# Patient Record
Sex: Male | Born: 1955 | Race: White | Hispanic: No | Marital: Married | Smoking: Never smoker
Health system: Southern US, Community
[De-identification: ages and names within clinical notes are randomized; demographics above are authoritative.]

## PROBLEM LIST (undated history)

## (undated) DIAGNOSIS — I10 Essential (primary) hypertension: Secondary | ICD-10-CM

## (undated) DIAGNOSIS — E7800 Pure hypercholesterolemia, unspecified: Secondary | ICD-10-CM

## (undated) DIAGNOSIS — C801 Malignant (primary) neoplasm, unspecified: Secondary | ICD-10-CM

## (undated) HISTORY — PX: TONSILLECTOMY: SUR1361

---

## 2011-12-17 HISTORY — PX: PROSTATECTOMY: SHX69

## 2016-12-13 ENCOUNTER — Emergency Department (HOSPITAL_BASED_OUTPATIENT_CLINIC_OR_DEPARTMENT_OTHER)
Admission: EM | Admit: 2016-12-13 | Discharge: 2016-12-13 | Disposition: A | Discharge status: Home / Self Care | Payer: BLUE CROSS/BLUE SHIELD | Attending: Emergency Medicine | Admitting: Emergency Medicine

## 2016-12-13 ENCOUNTER — Emergency Department (HOSPITAL_BASED_OUTPATIENT_CLINIC_OR_DEPARTMENT_OTHER): Payer: BLUE CROSS/BLUE SHIELD

## 2016-12-13 ENCOUNTER — Encounter (HOSPITAL_BASED_OUTPATIENT_CLINIC_OR_DEPARTMENT_OTHER): Payer: Self-pay

## 2016-12-13 DIAGNOSIS — N200 Calculus of kidney: Secondary | ICD-10-CM | POA: Diagnosis not present

## 2016-12-13 DIAGNOSIS — R109 Unspecified abdominal pain: Secondary | ICD-10-CM | POA: Diagnosis present

## 2016-12-13 DIAGNOSIS — I10 Essential (primary) hypertension: Secondary | ICD-10-CM | POA: Diagnosis not present

## 2016-12-13 HISTORY — DX: Pure hypercholesterolemia, unspecified: E78.00

## 2016-12-13 HISTORY — DX: Malignant (primary) neoplasm, unspecified: C80.1

## 2016-12-13 HISTORY — DX: Essential (primary) hypertension: I10

## 2016-12-13 LAB — CBC WITH DIFFERENTIAL/PLATELET
BASOS PCT: 0 %
Basophils Absolute: 0 10*3/uL (ref 0.0–0.1)
EOS ABS: 0.1 10*3/uL (ref 0.0–0.7)
Eosinophils Relative: 1 %
HEMATOCRIT: 44.4 % (ref 39.0–52.0)
Hemoglobin: 15.3 g/dL (ref 13.0–17.0)
Lymphocytes Relative: 20 %
Lymphs Abs: 1.8 10*3/uL (ref 0.7–4.0)
MCH: 29.5 pg (ref 26.0–34.0)
MCHC: 34.5 g/dL (ref 30.0–36.0)
MCV: 85.7 fL (ref 78.0–100.0)
MONO ABS: 0.8 10*3/uL (ref 0.1–1.0)
MONOS PCT: 8 %
NEUTROS ABS: 6.5 10*3/uL (ref 1.7–7.7)
Neutrophils Relative %: 71 %
Platelets: 223 10*3/uL (ref 150–400)
RBC: 5.18 MIL/uL (ref 4.22–5.81)
RDW: 13.2 % (ref 11.5–15.5)
WBC: 9.2 10*3/uL (ref 4.0–10.5)

## 2016-12-13 LAB — URINALYSIS, ROUTINE W REFLEX MICROSCOPIC
BILIRUBIN URINE: NEGATIVE
Glucose, UA: NEGATIVE mg/dL
KETONES UR: NEGATIVE mg/dL
Leukocytes, UA: NEGATIVE
NITRITE: NEGATIVE
PH: 5.5 (ref 5.0–8.0)
Protein, ur: NEGATIVE mg/dL
Specific Gravity, Urine: 1.022 (ref 1.005–1.030)

## 2016-12-13 LAB — BASIC METABOLIC PANEL
Anion gap: 10 (ref 5–15)
BUN: 23 mg/dL — ABNORMAL HIGH (ref 6–20)
CALCIUM: 8.3 mg/dL — AB (ref 8.9–10.3)
CO2: 23 mmol/L (ref 22–32)
CREATININE: 1.24 mg/dL (ref 0.61–1.24)
Chloride: 101 mmol/L (ref 101–111)
GFR calc Af Amer: >60 mL/min (ref >60)
GFR calc non Af Amer: >60 mL/min (ref >60)
GLUCOSE: 144 mg/dL — AB (ref 65–99)
Potassium: 3.1 mmol/L — ABNORMAL LOW (ref 3.5–5.1)
Sodium: 134 mmol/L — ABNORMAL LOW (ref 135–145)

## 2016-12-13 LAB — URINALYSIS, MICROSCOPIC (REFLEX)

## 2016-12-13 MED ORDER — KETOROLAC TROMETHAMINE 30 MG/ML IJ SOLN
30.0000 mg | Freq: Once | INTRAMUSCULAR | Status: AC
  Administered 2016-12-13: 30 mg via INTRAVENOUS
  Filled 2016-12-13: qty 1

## 2016-12-13 MED ORDER — ONDANSETRON HCL 4 MG/2ML IJ SOLN
4.0000 mg | Freq: Once | INTRAMUSCULAR | Status: AC
  Administered 2016-12-13: 4 mg via INTRAVENOUS
  Filled 2016-12-13: qty 2

## 2016-12-13 MED ORDER — SODIUM CHLORIDE 0.9 % IV BOLUS (SEPSIS)
500.0000 mL | Freq: Once | INTRAVENOUS | Status: AC
  Administered 2016-12-13: 500 mL via INTRAVENOUS

## 2016-12-13 MED ORDER — MORPHINE SULFATE (PF) 4 MG/ML IV SOLN
4.0000 mg | Freq: Once | INTRAVENOUS | Status: AC
  Administered 2016-12-13: 4 mg via INTRAVENOUS
  Filled 2016-12-13: qty 1

## 2016-12-13 NOTE — ED Triage Notes (Signed)
Pt states that yesterday he did not feel well, had 1 episode of vomiting and multiple episodes of diarrhea.  States that he did not drink a lot of fluids yesterday or eat.  He says that he was woken up at 0300 this morning with left flank pain.  He states that since then he had several glasses of water and was able to hold that down and says he feels better now.  He complains also of dysuria.

## 2016-12-13 NOTE — ED Notes (Signed)
Pt seems to have passed the stone with the urine sample provided

## 2016-12-13 NOTE — ED Notes (Signed)
Pt verbalizes understanding of d/c instructions an denies any further need at this time

## 2016-12-13 NOTE — Discharge Instructions (Signed)
Return to the ER if you develop high fever, worsening pain, vomiting, or other new and concerning symptoms.

## 2016-12-13 NOTE — ED Provider Notes (Signed)
[facility] Provider Note   CSN: [id number] Arrival date & time: 12/13/16  0531     History   Chief Complaint Chief Complaint  Patient presents with  . Flank Pain    HPI Albert Fernandez is a 60 y.o. male.  Patient is a 60 year old male who presents with complaints of left flank pain. He reports an episode of loose stools and vomiting yesterday. This seemed to subside, however he woke up early this morning with severe pain in his left flank. He feels nauseated but has not vomited since this pain began. He denies any fevers or chills. He denies urinary complaints.   The history is provided by the patient.  Flank Pain  This is a new problem. The current episode started less than 1 hour ago. The problem occurs constantly. The problem has been gradually improving. Pertinent negatives include no abdominal pain. Nothing aggravates the symptoms. Nothing relieves the symptoms. He has tried nothing for the symptoms.    Past Medical History:  Diagnosis Date  . High cholesterol   . Hypertension     There are no active problems to display for this patient.   Past Surgical History:  Procedure Laterality Date  . TONSILLECTOMY         Home Medications    Prior to Admission medications   Not on File    Family History No family history on file.  Social History Social History  Substance Use Topics  . Smoking status: Never Smoker  . Smokeless tobacco: Never Used  . Alcohol use Yes     Allergies   Shellfish allergy   Review of Systems Review of Systems  Gastrointestinal: Negative for abdominal pain.  Genitourinary: Positive for flank pain.  All other systems reviewed and are negative.    Physical Exam Updated Vital Signs BP 146/89 (BP Location: Right Arm)   Pulse 88   Temp 98.5 F (36.9 C) (Oral)   Resp 16   Ht 5\' 10"  (1.778 m)   Wt 210 lb (95.3 kg)   SpO2 96%   BMI 30.13 kg/m   Physical Exam  Constitutional: He is oriented to person,  place, and time. He appears well-developed and well-nourished. No distress.  HENT:  Head: Normocephalic and atraumatic.  Mouth/Throat: Oropharynx is clear and moist.  Neck: Normal range of motion. Neck supple.  Cardiovascular: Normal rate and regular rhythm.  Exam reveals no friction rub.   No murmur heard. Pulmonary/Chest: Effort normal and breath sounds normal. No respiratory distress. He has no wheezes. He has no rales.  Abdominal: Soft. Bowel sounds are normal. He exhibits no distension. There is tenderness. There is no rebound and no guarding.  There is tenderness to palpation in the left flank.  Musculoskeletal: Normal range of motion. He exhibits no edema.  Neurological: He is alert and oriented to person, place, and time. Coordination normal.  Skin: Skin is warm and dry. He is not diaphoretic.  Nursing note and vitals reviewed.    ED Treatments / Results  Labs (all labs ordered are listed, but only abnormal results are displayed) Labs Reviewed  BASIC METABOLIC PANEL  CBC WITH DIFFERENTIAL/PLATELET  URINALYSIS, ROUTINE W REFLEX MICROSCOPIC    EKG  EKG Interpretation None       Radiology No results found.  Procedures Procedures (including critical care time)  Medications Ordered in ED Medications  ketorolac (TORADOL) 30 MG/ML injection 30 mg (not administered)  morphine 4 MG/ML injection 4 mg (not administered)  ondansetron (ZOFRAN)  injection 4 mg (not administered)  sodium chloride 0.9 % bolus 500 mL (not administered)     Initial Impression / Assessment and Plan / ED Course  I have reviewed the triage vital signs and the nursing notes.  Pertinent labs & imaging results that were available during my care of the patient were reviewed by me and considered in my medical decision making (see chart for details).  Clinical Course     CT scan shows what appears to be a recently passed 4 mm stone within the urinary bladder. The patient's symptoms have resolved.  He urinated and there was a small stone noted. He will be discharged with when necessary return. His urinalysis shows blood, however no evidence for infection.  Final Clinical Impressions(s) / ED Diagnoses   Final diagnoses:  None    New Prescriptions New Prescriptions   No medications on file     Veryl Speak, MD 12/13/16 [phone]

## 2017-05-24 IMAGING — CT CT RENAL STONE PROTOCOL
2 of 4 series · 16 of 46 positions shown, 18 images · non-contrast
Comparison: None.

CLINICAL DATA: Left flank pain. Dysuria. History of prostatectomy.

EXAM:
CT ABDOMEN AND PELVIS WITHOUT CONTRAST
TECHNIQUE: Multidetector CT imaging of the abdomen and pelvis was performed
following the standard protocol without IV contrast.

[Series 2: axial st · axial · 0.92mm/px · z∈[-452,-12]mm · 13 of 98 slices shown, 15 images]
[im 5/98  soft-tissue]
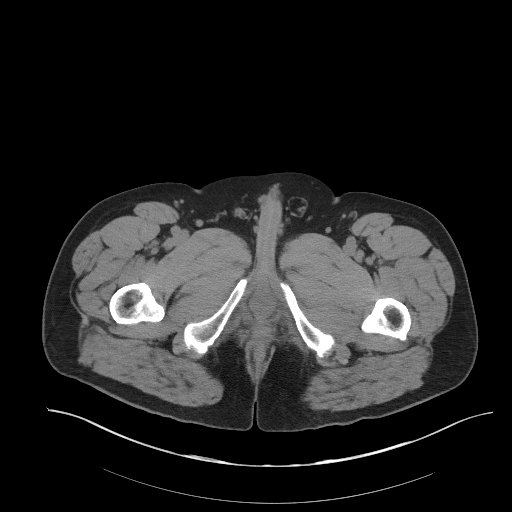
[im 5/98  bone]
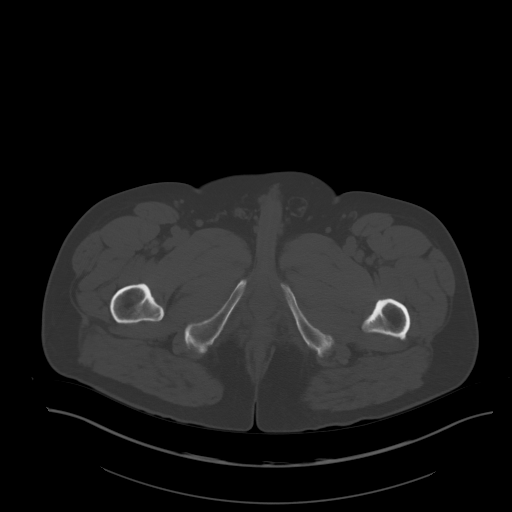
[im 13/98  soft-tissue]
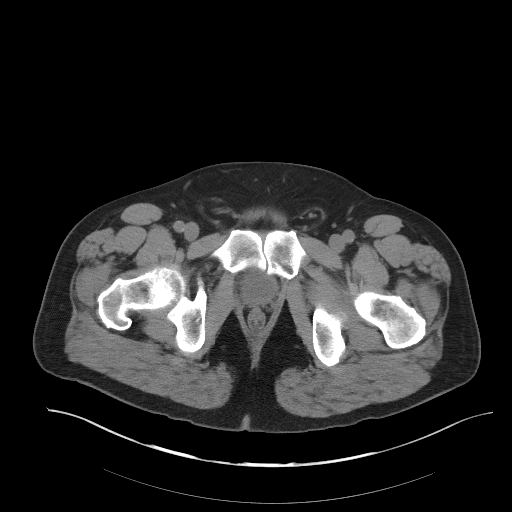
[im 21/98  soft-tissue]
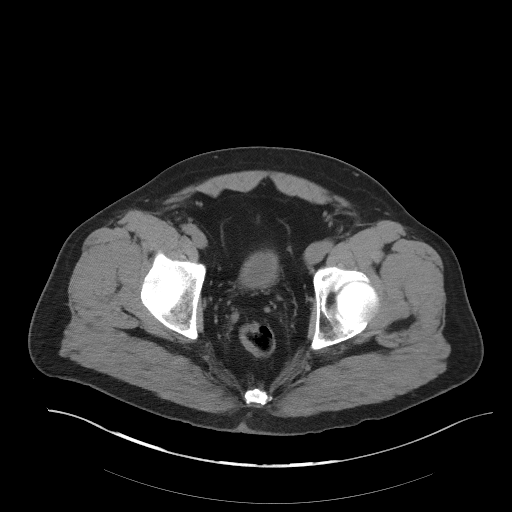
[im 29/98  soft-tissue]
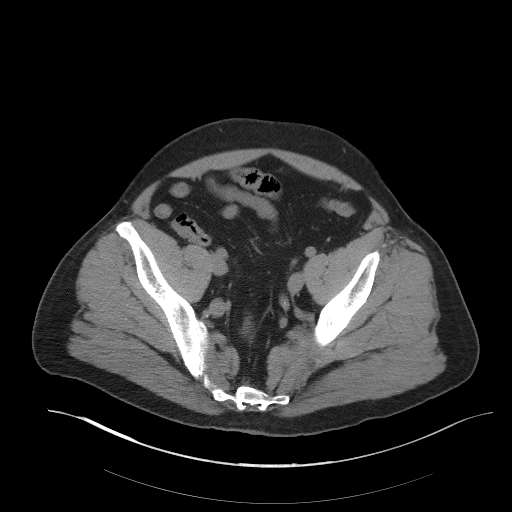
[im 33/98  soft-tissue]
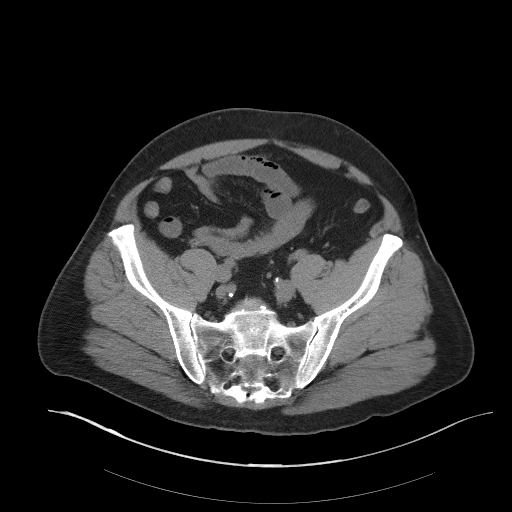
[im 41/98  soft-tissue]
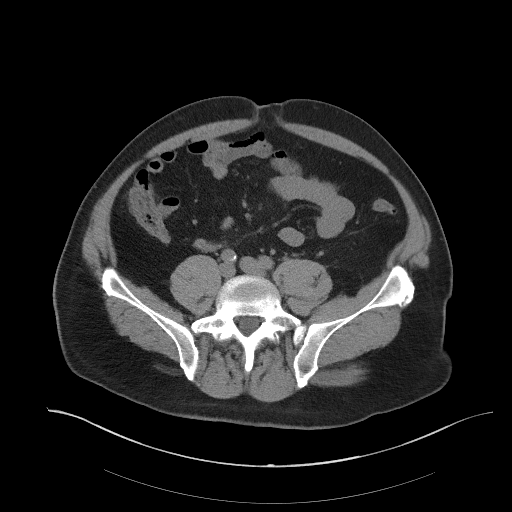
[im 49/98  soft-tissue]
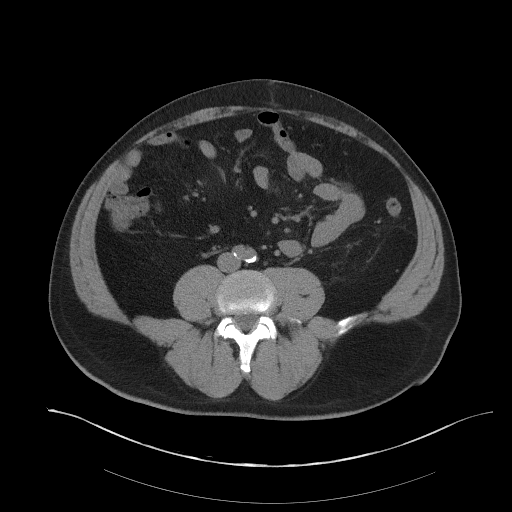
[im 57/98  soft-tissue]
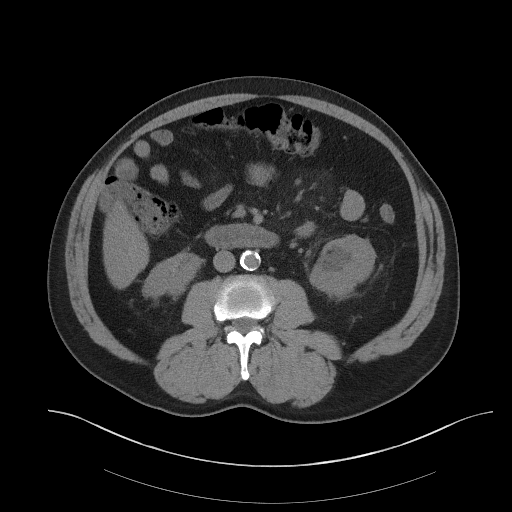
[im 65/98  soft-tissue]
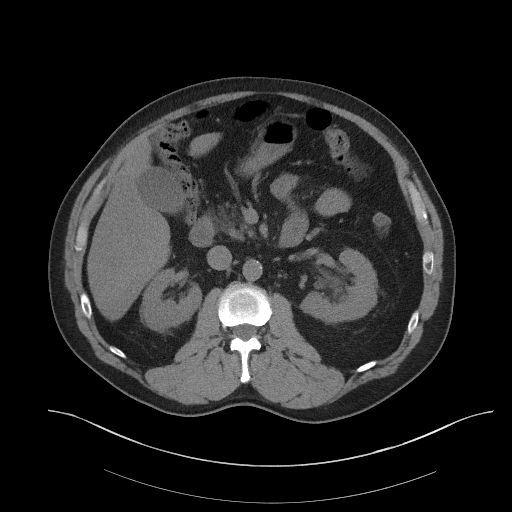
[im 65/98  bone]
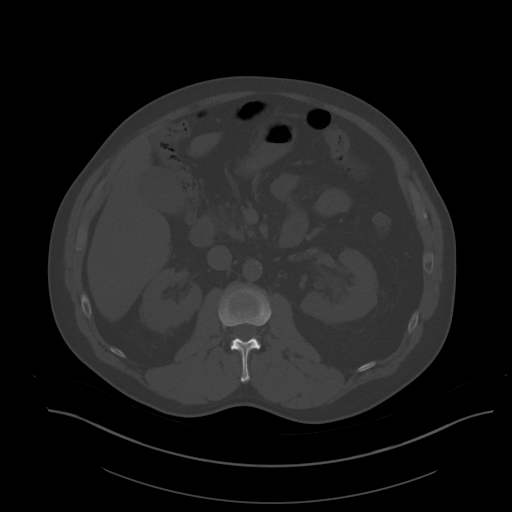
[im 69/98  soft-tissue]
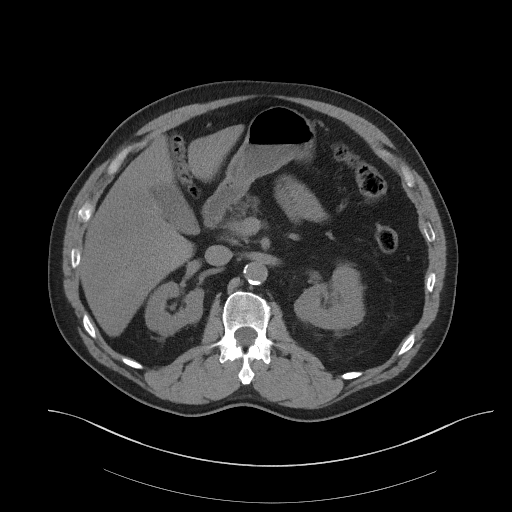
[im 77/98  soft-tissue]
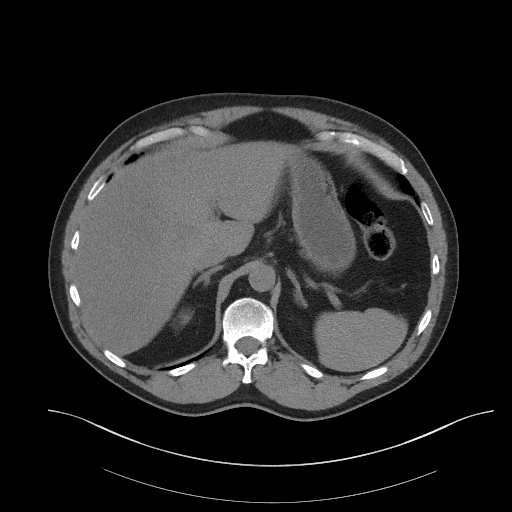
[im 85/98  soft-tissue]
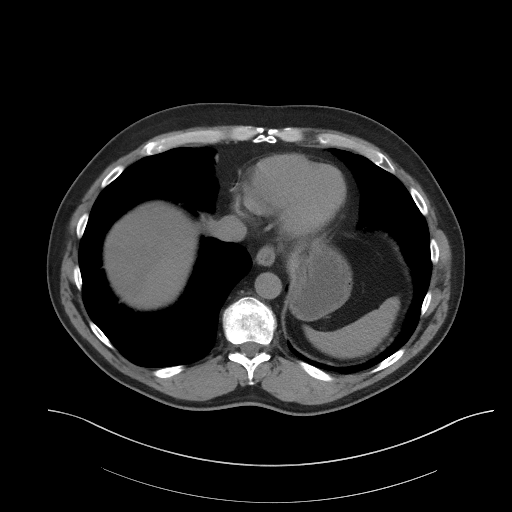
[im 93/98  soft-tissue]
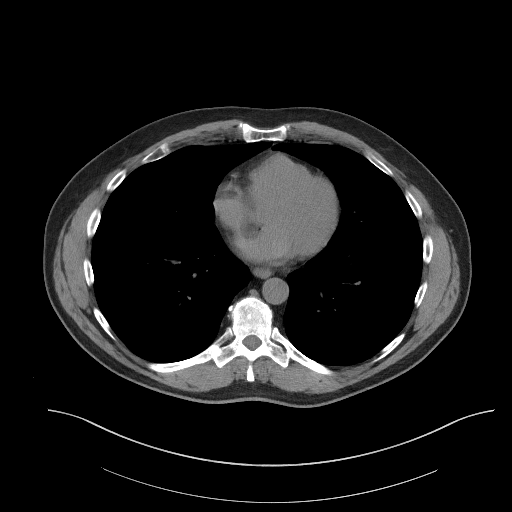

[Series 5: coronal st · coronal · 0.86mm/px · 3 of 94 slices shown]
[im 32/94  soft-tissue]
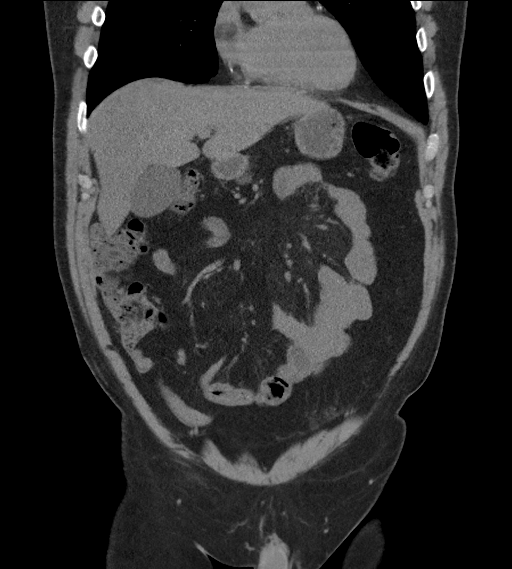
[im 42/94  soft-tissue]
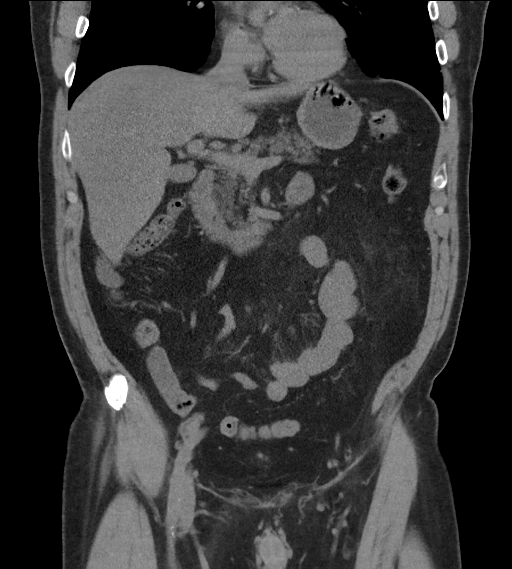
[im 52/94  soft-tissue]
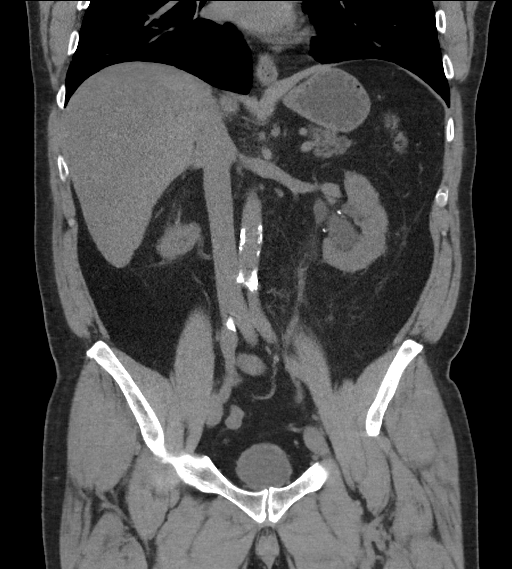

[16 of 46 positions shown; findings below may reference images not displayed]

FINDINGS: Lower chest: No pulmonary nodules or pleural effusion. No visible
pericardial effusion.

Hepatobiliary: Normal noncontrast appearance of the liver. No
visible biliary dilatation. Normal gallbladder.

Pancreas: Normal noncontrast appearance of the pancreas. No
peripancreatic fluid collection.

Spleen: Normal.

Adrenal glands: Normal.

Urinary Tract:

--Right kidney: No hydronephrosis or perinephric stranding. No
nephrolithiasis. No obstructing ureteral stones.

--Left kidney: There is moderate left hydronephrosis and mild
perinephric stranding. There is a hilar calcification near the lower
pole measuring 7 mm, which may be vascular or a nonobstructing renal
stone. There is no stone within the left ureter.

--Urinary bladder: There is a 4 mm stone within the lower portion of
the urinary bladder, near the proximal urethra.

Stomach/Bowel: No dilated loops of bowel. No evidence of colonic or
enteric inflammation. No fluid collection within the abdomen.

Vascular/Lymphatic: There is atherosclerotic calcification of the
non aneurysmal abdominal aorta. No abdominal or pelvic
lymphadenopathy.

Reproductive: Prostate is surgically absent.

Musculoskeletal. No lytic or blastic lesions. No bony spinal canal
stenosis.
IMPRESSION: 1. 4 mm stone within the lower portion of the urinary bladder, near
the bladder outlet.
2. Moderate left hydronephrosis and mild perinephric stranding.
There is currently no stone within the ureter. Findings are likely
secondary to the above-described stone that has probably recently
passed into the bladder.
3. Aortic atherosclerosis.
